# Patient Record
Sex: Male | Born: 2005 | Race: Black or African American | Hispanic: No | Marital: Single | Smoking: Never smoker
Health system: Southern US, Community
[De-identification: ages and names within clinical notes are randomized; demographics above are authoritative.]

---
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
## 2024-05-12 ENCOUNTER — Encounter (INDEPENDENT_AMBULATORY_CARE_PROVIDER_SITE_OTHER): Payer: Self-pay

## 2024-05-13 ENCOUNTER — Encounter (INDEPENDENT_AMBULATORY_CARE_PROVIDER_SITE_OTHER): Payer: Self-pay
